# Patient Record
Sex: Male | Born: 1985 | Race: White | Hispanic: No | Marital: Married | Smoking: Current every day smoker
Health system: Southern US, Community
[De-identification: ages and names within clinical notes are randomized; demographics above are authoritative.]

## PROBLEM LIST (undated history)

## (undated) HISTORY — PX: KNEE SURGERY: SHX244

## (undated) HISTORY — PX: HIP SURGERY: SHX245

---

## 2007-07-15 ENCOUNTER — Ambulatory Visit: Payer: Self-pay | Admitting: Family Medicine

## 2007-07-15 DIAGNOSIS — J309 Allergic rhinitis, unspecified: Secondary | ICD-10-CM | POA: Insufficient documentation

## 2007-07-15 DIAGNOSIS — E669 Obesity, unspecified: Secondary | ICD-10-CM

## 2007-07-15 DIAGNOSIS — K219 Gastro-esophageal reflux disease without esophagitis: Secondary | ICD-10-CM

## 2007-07-15 DIAGNOSIS — F17200 Nicotine dependence, unspecified, uncomplicated: Secondary | ICD-10-CM

## 2007-08-26 ENCOUNTER — Encounter (INDEPENDENT_AMBULATORY_CARE_PROVIDER_SITE_OTHER): Payer: Self-pay | Admitting: Family Medicine

## 2007-12-15 ENCOUNTER — Emergency Department (HOSPITAL_COMMUNITY): Admission: EM | Admit: 2007-12-15 | Discharge: 2007-12-15 | Payer: Self-pay | Admitting: Emergency Medicine

## 2008-05-01 ENCOUNTER — Ambulatory Visit: Payer: Self-pay | Admitting: Family Medicine

## 2008-05-05 ENCOUNTER — Ambulatory Visit (HOSPITAL_COMMUNITY): Admission: RE | Admit: 2008-05-05 | Discharge: 2008-05-05 | Payer: Self-pay | Admitting: Family Medicine

## 2008-11-06 ENCOUNTER — Ambulatory Visit: Payer: Self-pay | Admitting: Family Medicine

## 2008-11-08 LAB — CONVERTED CEMR LAB
AST: 16 units/L (ref 0–37)
Alkaline Phosphatase: 80 units/L (ref 39–117)
BUN: 15 mg/dL (ref 6–23)
Calcium: 9.3 mg/dL (ref 8.4–10.5)
Creatinine, Ser: 1.04 mg/dL (ref 0.40–1.50)
GC Probe Amp, Urine: NEGATIVE
HCV Ab: NEGATIVE
Hep A IgM: NEGATIVE
Total Bilirubin: 0.5 mg/dL (ref 0.3–1.2)

## 2009-05-05 ENCOUNTER — Emergency Department (HOSPITAL_COMMUNITY): Admission: EM | Admit: 2009-05-05 | Discharge: 2009-05-05 | Payer: Self-pay | Admitting: Emergency Medicine

## 2009-10-12 ENCOUNTER — Emergency Department (HOSPITAL_COMMUNITY): Admission: EM | Admit: 2009-10-12 | Discharge: 2009-10-12 | Payer: Self-pay | Admitting: Emergency Medicine

## 2010-01-09 ENCOUNTER — Emergency Department (HOSPITAL_COMMUNITY): Admission: EM | Admit: 2010-01-09 | Discharge: 2010-01-09 | Payer: Self-pay | Admitting: Emergency Medicine

## 2010-06-08 LAB — URINALYSIS, ROUTINE W REFLEX MICROSCOPIC
Bilirubin Urine: NEGATIVE
Ketones, ur: NEGATIVE mg/dL
Nitrite: NEGATIVE
Protein, ur: NEGATIVE mg/dL
pH: 6 (ref 5.0–8.0)

## 2010-08-04 ENCOUNTER — Emergency Department (HOSPITAL_COMMUNITY): Payer: Self-pay

## 2010-08-04 ENCOUNTER — Emergency Department (HOSPITAL_COMMUNITY)
Admission: EM | Admit: 2010-08-04 | Discharge: 2010-08-04 | Disposition: A | Discharge status: Home / Self Care | Payer: Self-pay | Attending: Emergency Medicine | Admitting: Emergency Medicine

## 2010-08-04 DIAGNOSIS — R059 Cough, unspecified: Secondary | ICD-10-CM | POA: Insufficient documentation

## 2010-08-04 DIAGNOSIS — R509 Fever, unspecified: Secondary | ICD-10-CM | POA: Insufficient documentation

## 2010-08-04 DIAGNOSIS — J329 Chronic sinusitis, unspecified: Secondary | ICD-10-CM | POA: Insufficient documentation

## 2010-08-04 DIAGNOSIS — R05 Cough: Secondary | ICD-10-CM | POA: Insufficient documentation

## 2010-08-04 DIAGNOSIS — Z9889 Other specified postprocedural states: Secondary | ICD-10-CM | POA: Insufficient documentation

## 2012-04-05 ENCOUNTER — Emergency Department (HOSPITAL_COMMUNITY)
Admission: EM | Admit: 2012-04-05 | Discharge: 2012-04-05 | Disposition: A | Discharge status: Home / Self Care | Payer: Medicaid Other | Attending: Emergency Medicine | Admitting: Emergency Medicine

## 2012-04-05 ENCOUNTER — Emergency Department (HOSPITAL_COMMUNITY): Payer: Medicaid Other

## 2012-04-05 ENCOUNTER — Encounter (HOSPITAL_COMMUNITY): Payer: Self-pay | Admitting: *Deleted

## 2012-04-05 DIAGNOSIS — J329 Chronic sinusitis, unspecified: Secondary | ICD-10-CM

## 2012-04-05 DIAGNOSIS — IMO0002 Reserved for concepts with insufficient information to code with codable children: Secondary | ICD-10-CM | POA: Insufficient documentation

## 2012-04-05 DIAGNOSIS — R0989 Other specified symptoms and signs involving the circulatory and respiratory systems: Secondary | ICD-10-CM | POA: Insufficient documentation

## 2012-04-05 DIAGNOSIS — B9789 Other viral agents as the cause of diseases classified elsewhere: Secondary | ICD-10-CM | POA: Insufficient documentation

## 2012-04-05 DIAGNOSIS — B349 Viral infection, unspecified: Secondary | ICD-10-CM

## 2012-04-05 DIAGNOSIS — Z79899 Other long term (current) drug therapy: Secondary | ICD-10-CM | POA: Insufficient documentation

## 2012-04-05 DIAGNOSIS — F17200 Nicotine dependence, unspecified, uncomplicated: Secondary | ICD-10-CM | POA: Insufficient documentation

## 2012-04-05 DIAGNOSIS — J3489 Other specified disorders of nose and nasal sinuses: Secondary | ICD-10-CM | POA: Insufficient documentation

## 2012-04-05 MED ORDER — ALBUTEROL SULFATE HFA 108 (90 BASE) MCG/ACT IN AERS
2.0000 | INHALATION_SPRAY | Freq: Once | RESPIRATORY_TRACT | Status: AC
  Administered 2012-04-05: 2 via RESPIRATORY_TRACT
  Filled 2012-04-05: qty 6.7

## 2012-04-05 MED ORDER — HYDROCOD POLST-CHLORPHEN POLST 10-8 MG/5ML PO LQCR: 5.0000 mL | Freq: Two times a day (BID) | ORAL | Status: DC

## 2012-04-05 MED ORDER — AMOXICILLIN-POT CLAVULANATE 875-125 MG PO TABS: 1.0000 | ORAL_TABLET | Freq: Two times a day (BID) | ORAL | Status: DC

## 2012-04-05 NOTE — ED Notes (Signed)
Cough and congestion x 1 week. States cough was productive, yellow in color but cough is now non productive.

## 2012-04-05 NOTE — ED Notes (Signed)
Pt with cough and chest congestion x 1 week

## 2012-04-07 NOTE — ED Provider Notes (Signed)
History     CSN: [id number]  Arrival date & time 04/05/12  1126   First MD Initiated Contact with Patient 04/05/12 1146      Chief Complaint  Patient presents with  . Cough  . Nasal Congestion    (Consider location/radiation/quality/duration/timing/severity/associated sxs/prior treatment) HPI Comments: Spencer Robinson is a 27 y.o. Male who presents with a 7 day history of uri type symptoms which includes nasal congestion with clear rhinorrhea, sinus pain and cough which was productive of yellow sputum,  But has become a more frequent cough but nonproductive.   Symptoms due to not include shortness of breath, chest pain,  Nausea, vomiting or diarrhea.  The patient has taken mucinex and nyquil  prior to arrival with no significant improvement in symptoms.    The history is provided by the patient.    History reviewed. No pertinent past medical history.  Past Surgical History  Procedure Date  . Hip surgery   . Knee surgery     No family history on file.  History  Substance Use Topics  . Smoking status: Current Every Day Smoker  . Smokeless tobacco: Not on file  . Alcohol Use: No      Review of Systems  Constitutional: Negative for fever.  HENT: Positive for congestion and rhinorrhea. Negative for sore throat and neck pain.   Eyes: Negative.   Respiratory: Positive for cough. Negative for chest tightness and shortness of breath.   Cardiovascular: Negative for chest pain.  Gastrointestinal: Negative for nausea and abdominal pain.  Genitourinary: Negative.   Musculoskeletal: Negative for joint swelling and arthralgias.  Skin: Negative.  Negative for rash and wound.  Neurological: Negative for dizziness, weakness, light-headedness, numbness and headaches.  Hematological: Negative.   Psychiatric/Behavioral: Negative.     Allergies  Review of patient's allergies indicates no known allergies.  Home Medications   Current Outpatient Rx  Name  Route  Sig  Dispense   Refill  . DM-GUAIFENESIN ER 30-600 MG PO TB12   Oral   Take 1 tablet by mouth every 12 (twelve) hours.         Marland Kitchen FLUTICASONE PROPIONATE 50 MCG/ACT NA SUSP   Nasal   Place 2 sprays into the nose daily.         Marland Kitchen PSEUDOEPH-DOXYLAMINE-DM-APAP 60-12.08-20-998 MG/30ML PO LIQD   Oral   Take 30 mLs by mouth every 4 (four) hours as needed. Cough and congestion.         . AMOXICILLIN-POT CLAVULANATE 875-125 MG PO TABS   Oral   Take 1 tablet by mouth 2 (two) times daily.   20 tablet   0   . HYDROCOD POLST-CPM POLST ER 10-8 MG/5ML PO LQCR   Oral   Take 5 mLs by mouth every 12 (twelve) hours.   100 mL   0     BP 135/82  Pulse 63  Temp 98.2 F (36.8 C) (Oral)  Resp 20  Ht 5\' 10"  (1.778 m)  Wt 240 lb (108.863 kg)  BMI 34.44 kg/m2  SpO2 94%  Physical Exam  Constitutional: He is oriented to person, place, and time. He appears well-developed and well-nourished.  HENT:  Head: Normocephalic and atraumatic.  Right Ear: Tympanic membrane and ear canal normal.  Left Ear: Tympanic membrane and ear canal normal.  Nose: Mucosal edema and rhinorrhea present. Right sinus exhibits maxillary sinus tenderness. Left sinus exhibits maxillary sinus tenderness.  Mouth/Throat: Uvula is midline, oropharynx is clear and moist and mucous membranes are normal.  No oropharyngeal exudate, posterior oropharyngeal edema, posterior oropharyngeal erythema or tonsillar abscesses.  Eyes: Conjunctivae normal are normal.  Cardiovascular: Normal rate and normal heart sounds.   Pulmonary/Chest: Effort normal. No respiratory distress. He has no decreased breath sounds. He has no wheezes. He has no rales.       Coarse crackles which clear with cough.  Abdominal: Soft. There is no tenderness.  Musculoskeletal: Normal range of motion.  Neurological: He is alert and oriented to person, place, and time.  Skin: Skin is warm and dry. No rash noted.  Psychiatric: He has a normal mood and affect.    ED Course    Procedures (including critical care time)  Labs Reviewed - No data to display No results found.   1. Viral syndrome   2. Sinusitis       MDM  Augmentin, tussionex, encouraged rest,  Increased fluid intake,  Continued mucinex.         Burgess Amor, [place] 04/07/12 2158

## 2012-04-07 NOTE — ED Provider Notes (Signed)
Medical screening examination/treatment/procedure(s) were performed by non-physician practitioner and as supervising physician I was immediately available for consultation/collaboration.   Joya Gaskins, MD 04/07/12 2322

## 2012-11-19 ENCOUNTER — Encounter (HOSPITAL_COMMUNITY): Payer: Self-pay | Admitting: *Deleted

## 2012-11-19 ENCOUNTER — Emergency Department (HOSPITAL_COMMUNITY)
Admission: EM | Admit: 2012-11-19 | Discharge: 2012-11-19 | Disposition: A | Discharge status: Home / Self Care | Payer: Medicaid Other | Attending: Emergency Medicine | Admitting: Emergency Medicine

## 2012-11-19 ENCOUNTER — Emergency Department (HOSPITAL_COMMUNITY): Payer: Medicaid Other

## 2012-11-19 DIAGNOSIS — M25569 Pain in unspecified knee: Secondary | ICD-10-CM | POA: Insufficient documentation

## 2012-11-19 DIAGNOSIS — M2550 Pain in unspecified joint: Secondary | ICD-10-CM | POA: Insufficient documentation

## 2012-11-19 DIAGNOSIS — IMO0002 Reserved for concepts with insufficient information to code with codable children: Secondary | ICD-10-CM | POA: Insufficient documentation

## 2012-11-19 DIAGNOSIS — M549 Dorsalgia, unspecified: Secondary | ICD-10-CM

## 2012-11-19 DIAGNOSIS — M12359 Palindromic rheumatism, unspecified hip: Secondary | ICD-10-CM | POA: Insufficient documentation

## 2012-11-19 DIAGNOSIS — Z791 Long term (current) use of non-steroidal anti-inflammatories (NSAID): Secondary | ICD-10-CM | POA: Insufficient documentation

## 2012-11-19 DIAGNOSIS — J029 Acute pharyngitis, unspecified: Secondary | ICD-10-CM | POA: Insufficient documentation

## 2012-11-19 DIAGNOSIS — J069 Acute upper respiratory infection, unspecified: Secondary | ICD-10-CM

## 2012-11-19 DIAGNOSIS — F17200 Nicotine dependence, unspecified, uncomplicated: Secondary | ICD-10-CM | POA: Insufficient documentation

## 2012-11-19 MED ORDER — BENZONATATE 100 MG PO CAPS: 100.0000 mg | ORAL_CAPSULE | Freq: Three times a day (TID) | ORAL | Status: DC | PRN

## 2012-11-19 MED ORDER — MELOXICAM 7.5 MG PO TABS: 15.0000 mg | ORAL_TABLET | Freq: Every day | ORAL | Status: DC

## 2012-11-19 MED ORDER — KETOROLAC TROMETHAMINE 60 MG/2ML IM SOLN
60.0000 mg | Freq: Once | INTRAMUSCULAR | Status: AC
  Administered 2012-11-19: 60 mg via INTRAMUSCULAR
  Filled 2012-11-19: qty 2
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
## 2012-11-19 NOTE — ED Notes (Signed)
Pt c/o a deep cough since Tuesday and pain in his left knee and left hip.

## 2013-03-04 ENCOUNTER — Emergency Department (HOSPITAL_COMMUNITY)
Admission: EM | Admit: 2013-03-04 | Discharge: 2013-03-04 | Disposition: A | Discharge status: Home / Self Care | Payer: Medicaid Other | Attending: Emergency Medicine | Admitting: Emergency Medicine

## 2013-03-04 ENCOUNTER — Encounter (HOSPITAL_COMMUNITY): Payer: Self-pay | Admitting: Emergency Medicine

## 2013-03-04 DIAGNOSIS — R51 Headache: Secondary | ICD-10-CM | POA: Insufficient documentation

## 2013-03-04 DIAGNOSIS — Y9389 Activity, other specified: Secondary | ICD-10-CM | POA: Insufficient documentation

## 2013-03-04 DIAGNOSIS — S025XXA Fracture of tooth (traumatic), initial encounter for closed fracture: Secondary | ICD-10-CM | POA: Insufficient documentation

## 2013-03-04 DIAGNOSIS — F17200 Nicotine dependence, unspecified, uncomplicated: Secondary | ICD-10-CM | POA: Insufficient documentation

## 2013-03-04 DIAGNOSIS — Y9289 Other specified places as the place of occurrence of the external cause: Secondary | ICD-10-CM | POA: Insufficient documentation

## 2013-03-04 DIAGNOSIS — X58XXXA Exposure to other specified factors, initial encounter: Secondary | ICD-10-CM | POA: Insufficient documentation

## 2013-03-04 MED ORDER — AMOXICILLIN 250 MG PO CAPS
500.0000 mg | ORAL_CAPSULE | Freq: Once | ORAL | Status: AC
  Administered 2013-03-04: 500 mg via ORAL
  Filled 2013-03-04: qty 2

## 2013-03-04 MED ORDER — HYDROCODONE-ACETAMINOPHEN 5-325 MG PO TABS: 1.0000 | ORAL_TABLET | ORAL | Status: DC | PRN

## 2013-03-04 MED ORDER — AMOXICILLIN 500 MG PO CAPS: 500.0000 mg | ORAL_CAPSULE | Freq: Three times a day (TID) | ORAL | Status: DC

## 2013-03-04 MED ORDER — IBUPROFEN 800 MG PO TABS
800.0000 mg | ORAL_TABLET | Freq: Once | ORAL | Status: AC
  Administered 2013-03-04: 800 mg via ORAL
  Filled 2013-03-04: qty 1

## 2013-03-04 MED ORDER — IBUPROFEN 800 MG PO TABS: 800.0000 mg | ORAL_TABLET | Freq: Three times a day (TID) | ORAL | Status: DC

## 2013-03-04 NOTE — ED Provider Notes (Signed)
CSN: [id number]     Arrival date & time 03/04/13  1931 History   First MD Initiated Contact with Patient 03/04/13 2016     Chief Complaint  Patient presents with  . Dental Pain   (Consider location/radiation/quality/duration/timing/severity/associated sxs/prior Treatment) Patient is a 27 y.o. male presenting with tooth pain. The history is provided by the patient.  Dental Pain Location:  Lower Lower teeth location:  31/RL 2nd molar Severity:  Moderate Onset quality:  Sudden Duration:  6 hours Timing:  Constant Progression:  Worsening Chronicity:  New Context: dental fracture   Context: not abscess   Relieved by:  Nothing Worsened by:  Cold food/drink and hot food/drink Ineffective treatments:  None tried Associated symptoms: facial pain   Associated symptoms: no neck pain     History reviewed. No pertinent past medical history. Past Surgical History  Procedure Laterality Date  . Hip surgery    . Knee surgery     History reviewed. No pertinent family history. History  Substance Use Topics  . Smoking status: Current Every Day Smoker  . Smokeless tobacco: Not on file  . Alcohol Use: No    Review of Systems  Constitutional: Negative for activity change.       All ROS Neg except as noted in HPI  HENT: Negative for nosebleeds.   Eyes: Negative for photophobia and discharge.  Respiratory: Negative for cough, shortness of breath and wheezing.   Cardiovascular: Negative for chest pain and palpitations.  Gastrointestinal: Negative for abdominal pain and blood in stool.  Genitourinary: Negative for dysuria, frequency and hematuria.  Musculoskeletal: Negative for arthralgias, back pain and neck pain.  Skin: Negative.   Neurological: Negative for dizziness, seizures and speech difficulty.  Psychiatric/Behavioral: Negative for hallucinations and confusion.    Allergies  Review of patient's allergies indicates no known allergies.  Home Medications  No current outpatient  prescriptions on file. BP 121/69  Pulse 76  Temp(Src) 98.3 F (36.8 C) (Oral)  Resp 18  Ht 5\' 10"  (1.778 m)  Wt 250 lb (113.399 kg)  BMI 35.87 kg/m2  SpO2 98% Physical Exam  Nursing note and vitals reviewed. Constitutional: He is oriented to person, place, and time. He appears well-developed and well-nourished.  Non-toxic appearance.  HENT:  Head: Normocephalic.  Right Ear: Tympanic membrane and external ear normal.  Left Ear: Tympanic membrane and external ear normal.  Mouth/Throat:    Airway patent. No swelling under the tongue.  Eyes: EOM and lids are normal. Pupils are equal, round, and reactive to light.  Neck: Normal range of motion. Neck supple. Carotid bruit is not present.  Cardiovascular: Normal rate, regular rhythm, normal heart sounds, intact distal pulses and normal pulses.   Pulmonary/Chest: Breath sounds normal. No respiratory distress.  Abdominal: Soft. Bowel sounds are normal. There is no tenderness. There is no guarding.  Musculoskeletal: Normal range of motion.  Lymphadenopathy:       Head (right side): No submandibular adenopathy present.       Head (left side): No submandibular adenopathy present.    He has no cervical adenopathy.  Neurological: He is alert and oriented to person, place, and time. He has normal strength. No cranial nerve deficit or sensory deficit.  Skin: Skin is warm and dry.  Psychiatric: He has a normal mood and affect. His speech is normal.    ED Course  Procedures (including critical care time) Labs Review Labs Reviewed - No data to display Imaging Review No results found.  EKG Interpretation  None       MDM  No diagnosis found. **I have reviewed nursing notes, vital signs, and all appropriate lab and imaging results for this patient.*  The patient states he was eating lunch when he took a bite and felt a crunch on the left back to. He then noticed a sharp edge that was cutting on his tongue, and a pain that was from his  jaw into his face. There's been no high fever or reported.  Patient has a broken tooth at the left lower mandible area. Patient advised to use temporary filling from the drugstore. Prescription for Amoxil, hydrocodone, and ibuprofen given to the patient. Patient has an appointment with the dentist on Monday, December 15.  Kathie Dike, PA-C 03/04/13 2033

## 2013-03-04 NOTE — ED Notes (Signed)
Pain lt mandibular molar , broke today when eating

## 2013-03-05 NOTE — ED Provider Notes (Signed)
History/physical exam/procedure(s) were performed by non-physician practitioner and as supervising physician I was immediately available for consultation/collaboration. I have reviewed all notes and am in agreement with care and plan.   Hilario Quarry, MD 03/05/13 [phone]

## 2013-08-30 ENCOUNTER — Encounter (HOSPITAL_COMMUNITY): Payer: Self-pay | Admitting: Emergency Medicine

## 2013-08-30 DIAGNOSIS — R05 Cough: Secondary | ICD-10-CM | POA: Insufficient documentation

## 2013-08-30 DIAGNOSIS — F17200 Nicotine dependence, unspecified, uncomplicated: Secondary | ICD-10-CM | POA: Insufficient documentation

## 2013-08-30 DIAGNOSIS — J3489 Other specified disorders of nose and nasal sinuses: Secondary | ICD-10-CM | POA: Insufficient documentation

## 2013-08-30 DIAGNOSIS — Z791 Long term (current) use of non-steroidal anti-inflammatories (NSAID): Secondary | ICD-10-CM | POA: Insufficient documentation

## 2013-08-30 DIAGNOSIS — Z792 Long term (current) use of antibiotics: Secondary | ICD-10-CM | POA: Insufficient documentation

## 2013-08-30 DIAGNOSIS — R059 Cough, unspecified: Secondary | ICD-10-CM | POA: Insufficient documentation

## 2013-08-30 DIAGNOSIS — IMO0001 Reserved for inherently not codable concepts without codable children: Secondary | ICD-10-CM | POA: Insufficient documentation

## 2013-08-30 DIAGNOSIS — R079 Chest pain, unspecified: Secondary | ICD-10-CM | POA: Insufficient documentation

## 2013-08-30 NOTE — ED Notes (Signed)
Pt c/o cough since Sunday.

## 2013-08-31 ENCOUNTER — Emergency Department (HOSPITAL_COMMUNITY)
Admission: EM | Admit: 2013-08-31 | Discharge: 2013-08-31 | Disposition: A | Discharge status: Home / Self Care | Payer: Medicaid Other | Attending: Emergency Medicine | Admitting: Emergency Medicine

## 2013-08-31 DIAGNOSIS — R05 Cough: Secondary | ICD-10-CM

## 2013-08-31 DIAGNOSIS — R059 Cough, unspecified: Secondary | ICD-10-CM

## 2013-08-31 MED ORDER — GUAIFENESIN-CODEINE 100-10 MG/5ML PO SYRP: 10.0000 mL | ORAL_SOLUTION | Freq: Three times a day (TID) | ORAL | Status: DC | PRN

## 2013-08-31 MED ORDER — BENZONATATE 100 MG PO CAPS
200.0000 mg | ORAL_CAPSULE | Freq: Once | ORAL | Status: AC
  Administered 2013-08-31: 200 mg via ORAL
  Filled 2013-08-31: qty 2

## 2013-08-31 MED ORDER — AZITHROMYCIN 250 MG PO TABS: ORAL_TABLET | ORAL | Status: DC

## 2013-08-31 MED ORDER — AZITHROMYCIN 250 MG PO TABS
500.0000 mg | ORAL_TABLET | Freq: Once | ORAL | Status: AC
  Administered 2013-08-31: 500 mg via ORAL
  Filled 2013-08-31: qty 2

## 2013-08-31 NOTE — Discharge Instructions (Signed)
Cough, Adult  A cough is a reflex. It helps you clear your throat and airways. A cough can help heal your body. A cough can last 2 or 3 weeks (acute) or may last more than 8 weeks (chronic). Some common causes of a cough can include an infection, allergy, or a cold. HOME CARE  Only take medicine as told by your doctor.  If given, take your medicines (antibiotics) as told. Finish them even if you start to feel better.  Use a cold steam vaporizer or humidier in your home. This can help loosen thick spit (secretions).  Sleep so you are almost sitting up (semi-upright). Use pillows to do this. This helps reduce coughing.  Rest as needed.  Stop smoking if you smoke. GET HELP RIGHT AWAY IF:  You have yellowish-white fluid (pus) in your thick spit.  Your cough gets worse.  Your medicine does not reduce coughing, and you are losing sleep.  You cough up blood.  You have trouble breathing.  Your pain gets worse and medicine does not help.  You have a fever. MAKE SURE YOU:   Understand these instructions.  Will watch your condition.  Will get help right away if you are not doing well or get worse. Document Released: 11/21/2010 Document Revised: 06/02/2011 Document Reviewed: 11/21/2010 [employer] Patient Information 2014 [employer].  

## 2013-09-01 NOTE — ED Provider Notes (Signed)
Medical screening examination/treatment/procedure(s) were performed by non-physician practitioner and as supervising physician I was immediately available for consultation/collaboration.   Justis Closser, MD 09/01/13 2259 

## 2013-09-01 NOTE — ED Provider Notes (Signed)
CSN: [id number]     Arrival date & time 08/30/13  2309 History   First MD Initiated Contact with Patient 08/30/13 2354     Chief Complaint  Patient presents with  . Cough     (Consider location/radiation/quality/duration/timing/severity/associated sxs/prior Treatment) Patient is a 28 y.o. male presenting with cough. The history is provided by the patient.  Cough Cough characteristics:  Productive Sputum characteristics:  Yellow Severity:  Mild Onset quality:  Gradual Duration:  2 days Timing:  Intermittent Progression:  Unchanged Chronicity:  New Smoker: yes   Context: upper respiratory infection   Context: not exposure to allergens, not fumes, not sick contacts and not smoke exposure   Relieved by:  Nothing Worsened by:  Nothing tried Associated symptoms: myalgias   Associated symptoms: no chest pain, no chills, no ear pain, no fever, no headaches, no rash, no rhinorrhea, no shortness of breath, no sinus congestion, no sore throat and no wheezing   Myalgias:    Location:  Generalized   Quality:  Aching   Severity:  Mild   Timing:  Constant   Progression:  Unchanged   History reviewed. No pertinent past medical history. Past Surgical History  Procedure Laterality Date  . Hip surgery    . Knee surgery     History reviewed. No pertinent family history. History  Substance Use Topics  . Smoking status: Current Every Day Smoker  . Smokeless tobacco: Not on file  . Alcohol Use: No    Review of Systems  Constitutional: Negative for fever, chills, activity change and appetite change.  HENT: Positive for congestion. Negative for ear pain, facial swelling, rhinorrhea, sore throat and trouble swallowing.   Eyes: Negative for visual disturbance.  Respiratory: Positive for cough and chest tightness. Negative for shortness of breath, wheezing and stridor.   Cardiovascular: Negative for chest pain.  Gastrointestinal: Negative for nausea and vomiting.  Genitourinary: Negative  for dysuria, hematuria and flank pain.  Musculoskeletal: Positive for myalgias. Negative for neck pain and neck stiffness.  Skin: Negative.  Negative for rash.  Neurological: Negative for dizziness, weakness, numbness and headaches.  Hematological: Negative for adenopathy.  Psychiatric/Behavioral: Negative for confusion.  All other systems reviewed and are negative.     Allergies  Review of patient's allergies indicates no known allergies.  Home Medications   Prior to Admission medications   Medication Sig Start Date End Date Taking? Authorizing Provider  amoxicillin (AMOXIL) 500 MG capsule Take 1 capsule (500 mg total) by mouth 3 (three) times daily. 03/04/13   Kathie Dike, PA-C  azithromycin (ZITHROMAX) 250 MG tablet Take two tablets on day one, then one tab qd days 2-5 08/31/13   Loreal Schuessler L. Cleaster Shiffer, PA-C  guaiFENesin-codeine (ROBITUSSIN AC) 100-10 MG/5ML syrup Take 10 mLs by mouth 3 (three) times daily as needed for cough. 08/31/13   Jaleya Pebley L. Jalena Vanderlinden, PA-C  HYDROcodone-acetaminophen (NORCO) 5-325 MG per tablet Take 1 tablet by mouth every 4 (four) hours as needed for moderate pain. 03/04/13   Kathie Dike, PA-C  ibuprofen (ADVIL,MOTRIN) 800 MG tablet Take 1 tablet (800 mg total) by mouth 3 (three) times daily. 03/04/13   Kathie Dike, PA-C   BP 123/73  Pulse 72  Temp(Src) 98.2 F (36.8 C) (Oral)  Resp 18  Ht 5\' 10"  (1.778 m)  Wt 250 lb (113.399 kg)  BMI 35.87 kg/m2  SpO2 95% Physical Exam  Nursing note and vitals reviewed. Constitutional: He is oriented to person, place, and time. He appears well-developed and  well-nourished. No distress.  HENT:  Head: Normocephalic and atraumatic.  Right Ear: Tympanic membrane and ear canal normal.  Left Ear: Tympanic membrane and ear canal normal.  Mouth/Throat: Uvula is midline, oropharynx is clear and moist and mucous membranes are normal. No oropharyngeal exudate.  Eyes: EOM are normal. Pupils are equal, round, and reactive to  light.  Neck: Normal range of motion, full passive range of motion without pain and phonation normal. Neck supple.  Cardiovascular: Normal rate, regular rhythm, normal heart sounds and intact distal pulses.   No murmur heard. Pulmonary/Chest: Effort normal. No stridor. No respiratory distress. He has no wheezes. He has no rales. He exhibits no tenderness.  Coarse lungs sounds bilaterally. No rales or wheezing  Musculoskeletal: Normal range of motion. He exhibits no edema.  Lymphadenopathy:    He has no cervical adenopathy.  Neurological: He is alert and oriented to person, place, and time. He exhibits normal muscle tone. Coordination normal.  Skin: Skin is warm and dry.    ED Course  Procedures (including critical care time) Labs Review Labs Reviewed - No data to display  Imaging Review No results found.   EKG Interpretation None      MDM   Final diagnoses:  Cough    VSS.  Pt is well appearing.  Non-toxic.  Current smoker with productive cough.  No rales or wheezes. PERC negative.  Pt agrees to tx with z pack and tessalon , fluids and ibuprofen if needed.  He appears stable for d/c and agrees to return if needed.  appears stable for d/c     Deric Bocock L. Trisha Mangleriplett, PA-C 09/01/13 1724

## 2013-10-01 ENCOUNTER — Encounter (HOSPITAL_COMMUNITY): Payer: Self-pay | Admitting: Emergency Medicine

## 2013-10-01 ENCOUNTER — Emergency Department (HOSPITAL_COMMUNITY)
Admission: EM | Admit: 2013-10-01 | Discharge: 2013-10-01 | Disposition: A | Discharge status: Home / Self Care | Payer: Medicaid Other | Attending: Emergency Medicine | Admitting: Emergency Medicine

## 2013-10-01 DIAGNOSIS — Z792 Long term (current) use of antibiotics: Secondary | ICD-10-CM | POA: Insufficient documentation

## 2013-10-01 DIAGNOSIS — IMO0002 Reserved for concepts with insufficient information to code with codable children: Secondary | ICD-10-CM | POA: Diagnosis not present

## 2013-10-01 DIAGNOSIS — S4980XA Other specified injuries of shoulder and upper arm, unspecified arm, initial encounter: Secondary | ICD-10-CM | POA: Diagnosis present

## 2013-10-01 DIAGNOSIS — F17200 Nicotine dependence, unspecified, uncomplicated: Secondary | ICD-10-CM | POA: Diagnosis not present

## 2013-10-01 DIAGNOSIS — S46909A Unspecified injury of unspecified muscle, fascia and tendon at shoulder and upper arm level, unspecified arm, initial encounter: Secondary | ICD-10-CM | POA: Diagnosis present

## 2013-10-01 DIAGNOSIS — S46919A Strain of unspecified muscle, fascia and tendon at shoulder and upper arm level, unspecified arm, initial encounter: Secondary | ICD-10-CM

## 2013-10-01 DIAGNOSIS — S40029A Contusion of unspecified upper arm, initial encounter: Secondary | ICD-10-CM | POA: Diagnosis not present

## 2013-10-01 DIAGNOSIS — Y9289 Other specified places as the place of occurrence of the external cause: Secondary | ICD-10-CM | POA: Insufficient documentation

## 2013-10-01 DIAGNOSIS — X58XXXA Exposure to other specified factors, initial encounter: Secondary | ICD-10-CM | POA: Insufficient documentation

## 2013-10-01 DIAGNOSIS — Z791 Long term (current) use of non-steroidal anti-inflammatories (NSAID): Secondary | ICD-10-CM | POA: Insufficient documentation

## 2013-10-01 DIAGNOSIS — Z79899 Other long term (current) drug therapy: Secondary | ICD-10-CM | POA: Diagnosis not present

## 2013-10-01 DIAGNOSIS — S301XXA Contusion of abdominal wall, initial encounter: Secondary | ICD-10-CM | POA: Diagnosis not present

## 2013-10-01 DIAGNOSIS — Y9389 Activity, other specified: Secondary | ICD-10-CM | POA: Diagnosis not present

## 2013-10-01 MED ORDER — METHOCARBAMOL 500 MG PO TABS: 500.0000 mg | ORAL_TABLET | Freq: Three times a day (TID) | ORAL | Status: DC

## 2013-10-01 MED ORDER — IBUPROFEN 800 MG PO TABS
800.0000 mg | ORAL_TABLET | Freq: Once | ORAL | Status: AC
  Administered 2013-10-01: 800 mg via ORAL
  Filled 2013-10-01: qty 1

## 2013-10-01 MED ORDER — IBUPROFEN 800 MG PO TABS: 800.0000 mg | ORAL_TABLET | Freq: Three times a day (TID) | ORAL | Status: DC

## 2013-10-01 MED ORDER — HYDROCODONE-ACETAMINOPHEN 5-325 MG PO TABS: 1.0000 | ORAL_TABLET | ORAL | Status: DC | PRN

## 2013-10-01 MED ORDER — ACETAMINOPHEN 500 MG PO TABS
1000.0000 mg | ORAL_TABLET | Freq: Once | ORAL | Status: AC
  Administered 2013-10-01: 1000 mg via ORAL
  Filled 2013-10-01: qty 2
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
## 2013-10-01 NOTE — ED Provider Notes (Signed)
CSN: [id number]     Arrival date & time 10/01/13  2102 History   First MD Initiated Contact with Patient 10/01/13 2111     No chief complaint on file.    (Consider location/radiation/quality/duration/timing/severity/associated sxs/prior Treatment) HPI Comments: Patient states that today around noon or 1:00 PM he was lowering a vault into a grave, when his lowering device nest up in one of the cables snapped. The patient states he sustained pain and injury to the forearms (right and left). He also noticed a bruise to the right lower abdomen. The patient complains of pain and soreness of both there's been increasing since approximately 1 PM today. The patient has not lost any control of his grip, there's been no problem with using his arms with exception of soreness. The patient denies being on any anticoagulation medications. The patient denies having any bleeding disorders.  The history is provided by the patient.    History reviewed. No pertinent past medical history. Past Surgical History  Procedure Laterality Date  . Hip surgery    . Knee surgery     History reviewed. No pertinent family history. History  Substance Use Topics  . Smoking status: Current Every Day Smoker  . Smokeless tobacco: Not on file  . Alcohol Use: No    Review of Systems  Constitutional: Negative for activity change.       All ROS Neg except as noted in HPI  HENT: Negative for nosebleeds.   Eyes: Negative for photophobia and discharge.  Respiratory: Negative for cough, shortness of breath and wheezing.   Cardiovascular: Negative for chest pain and palpitations.  Gastrointestinal: Negative for abdominal pain and blood in stool.  Genitourinary: Negative for dysuria, frequency and hematuria.  Musculoskeletal: Negative for arthralgias, back pain and neck pain.  Skin: Negative.   Neurological: Negative for dizziness, seizures and speech difficulty.  Psychiatric/Behavioral: Negative for hallucinations and  confusion.      Allergies  Review of patient's allergies indicates no known allergies.  Home Medications   Prior to Admission medications   Medication Sig Start Date End Date Taking? Authorizing Provider  amoxicillin (AMOXIL) 500 MG capsule Take 1 capsule (500 mg total) by mouth 3 (three) times daily. 03/04/13   Kathie Dike, PA-C  azithromycin (ZITHROMAX) 250 MG tablet Take two tablets on day one, then one tab qd days 2-5 08/31/13   Tammy L. Triplett, PA-C  guaiFENesin-codeine (ROBITUSSIN AC) 100-10 MG/5ML syrup Take 10 mLs by mouth 3 (three) times daily as needed for cough. 08/31/13   Tammy L. Triplett, PA-C  HYDROcodone-acetaminophen (NORCO) 5-325 MG per tablet Take 1 tablet by mouth every 4 (four) hours as needed for moderate pain. 03/04/13   Kathie Dike, PA-C  ibuprofen (ADVIL,MOTRIN) 800 MG tablet Take 1 tablet (800 mg total) by mouth 3 (three) times daily. 03/04/13   Kathie Dike, PA-C   BP 143/76  Pulse 81  Temp(Src) 97.8 F (36.6 C) (Oral)  Resp 18  Ht 5\' 10"  (1.778 m)  Wt 240 lb (108.863 kg)  BMI 34.44 kg/m2  SpO2 97% Physical Exam  Nursing note and vitals reviewed. Constitutional: He is oriented to person, place, and time. He appears well-developed and well-nourished.  Non-toxic appearance.  HENT:  Head: Normocephalic.  Right Ear: Tympanic membrane and external ear normal.  Left Ear: Tympanic membrane and external ear normal.  Eyes: EOM and lids are normal. Pupils are equal, round, and reactive to light.  Neck: Normal range of motion. Neck supple. Carotid bruit  is not present.  Cardiovascular: Normal rate, regular rhythm, normal heart sounds, intact distal pulses and normal pulses.   Pulmonary/Chest: Breath sounds normal. No respiratory distress.  Abdominal: Soft. Bowel sounds are normal. There is no tenderness. There is no guarding.  There is a bruise to the right lower abdomen wall. Mild to moderate tenderness present. Bowel sounds are present and active.  No mass appreciated. No active, vomiting or nausea during the emergency department examination.  Musculoskeletal: Normal range of motion.  There is full range of motion of the shoulders and elbows. There is soreness from the elbow to the wrists at the ulnar aspect on the right. There is bruising from the mid forearm up to the elbow area. There is no effusion of the elbow.  There is soreness of the mid forearm on the left. No significant bruising noted on the left. I cannot demonstrate any deformity of the right or the left upper extremity. Capillary refill is less than 2 seconds. Radial pulses are 2+. There no evidence for compartment problems of the forearms.  Lymphadenopathy:       Head (right side): No submandibular adenopathy present.       Head (left side): No submandibular adenopathy present.    He has no cervical adenopathy.  Neurological: He is alert and oriented to person, place, and time. He has normal strength. No cranial nerve deficit or sensory deficit.  Skin: Skin is warm and dry.  Psychiatric: He has a normal mood and affect. His speech is normal.    ED Course  Procedures (including critical care time) Labs Review Labs Reviewed - No data to display  Imaging Review No results found.   EKG Interpretation None      MDM Patient has some bruising of the forearms of the right and the left. There is evidence of muscle strain involving the forearms of the right and left upper extremity. There is a bruise to the right lower abdomen wall. The patient is asked to use ice packs to these areas,   to rest the areas is much as possible. He is given a prescription for ibuprofen, Norco, and Robaxin. The patient is to return if any changes, problems, or concerns.                       Final diagnoses:  None    **I have reviewed nursing notes, vital signs, and all appropriate lab and imaging results for this patient.Kathie Dike*    Rafe Mackowski M Danyell Awbrey, PA-C 10/01/13 2149

## 2013-10-01 NOTE — ED Notes (Signed)
Pt states he was lowering a vault in the ground today and he think a cable may have snapped. Pt c/o bilateral forearm pain and has a big bruise on his lower abdomen.

## 2013-10-01 NOTE — ED Notes (Signed)
Patient verbalizes understanding of discharge instructions, medications, and home care. Pt ambulatory out of department at this time.

## 2013-10-01 NOTE — Discharge Instructions (Signed)
Your examination is consistent with a muscle strain and bruising of multiple sites. Please apply ice to your forearms anterior abdomen. Please rest these areas over the next 48 hours. Please use Norco every 4 hours if needed for pain. Please use ibuprofen and Robaxin daily. Robaxin and Norco may cause drowsiness, please use with caution. [facility] for additional followup if not improving. Contusion A contusion is a deep bruise. Contusions happen when an injury causes bleeding under the skin. Signs of bruising include pain, puffiness (swelling), and discolored skin. The contusion may turn blue, purple, or yellow. HOME CARE   Put ice on the injured area.  Put ice in a plastic bag.  Place a towel between your skin and the bag.  Leave the ice on for 15-20 minutes, 03-04 times a day.  Only take medicine as told by your doctor.  Rest the injured area.  If possible, raise (elevate) the injured area to lessen puffiness. GET HELP RIGHT AWAY IF:   You have more bruising or puffiness.  You have pain that is getting worse.  Your puffiness or pain is not helped by medicine. MAKE SURE YOU:   Understand these instructions.  Will watch your condition.  Will get help right away if you are not doing well or get worse. Document Released: 08/27/2007 Document Revised: 06/02/2011 Document Reviewed: 01/13/2011 [facility] Patient Information 2015 [place], [place]. This information is not intended to replace advice given to you by your health care provider. Make sure you discuss any questions you have with your health care provider.  Muscle Strain A muscle strain (pulled muscle) happens when a muscle is stretched beyond normal length. It happens when a sudden, violent force stretches your muscle too far. Usually, a few of the fibers in your muscle are torn. Muscle strain is common in athletes. Recovery usually takes 1-2 weeks. Complete healing takes 5-6 weeks.    HOME CARE   Follow the PRICE method of treatment to help your injury get better. Do this the first 2-3 days after the injury:  Protect. Protect the muscle to keep it from getting injured again.  Rest. Limit your activity and rest the injured body part.  Ice. Put ice in a plastic bag. Place a towel between your skin and the bag. Then, apply the ice and leave it on from 15-20 minutes each hour. After the third day, switch to moist heat packs.  Compression. Use a splint or elastic bandage on the injured area for comfort. Do not put it on too tightly.  Elevate. Keep the injured body part above the level of your heart.  Only take medicine as told by your doctor.  Warm up before doing exercise to prevent future muscle strains. GET HELP IF:   You have more pain or puffiness (swelling) in the injured area.  You feel numbness, tingling, or notice a loss of strength in the injured area. MAKE SURE YOU:   Understand these instructions.  Will watch your condition.  Will get help right away if you are not doing well or get worse. Document Released: 12/18/2007 Document Revised: 12/29/2012 Document Reviewed: 10/07/2012 [facility] Patient Information 2015 [place], [place]. This information is not intended to replace advice given to you by your health care provider. Make sure you discuss any questions you have with your health care provider.

## 2013-10-01 NOTE — ED Notes (Signed)
Ice packs applied to the abdomen & both for arms.

## 2013-10-02 NOTE — ED Provider Notes (Signed)
Medical screening examination/treatment/procedure(s) were performed by non-physician practitioner and as supervising physician I was immediately available for consultation/collaboration.   EKG Interpretation None        Aiyla Baucom L Mckade Gurka, MD 10/02/13 1454 

## 2013-12-20 ENCOUNTER — Encounter (HOSPITAL_COMMUNITY): Payer: Self-pay | Admitting: Emergency Medicine

## 2013-12-20 ENCOUNTER — Emergency Department (HOSPITAL_COMMUNITY)
Admission: EM | Admit: 2013-12-20 | Discharge: 2013-12-21 | Disposition: A | Discharge status: Home / Self Care | Payer: Medicaid Other | Attending: Emergency Medicine | Admitting: Emergency Medicine

## 2013-12-20 DIAGNOSIS — F17200 Nicotine dependence, unspecified, uncomplicated: Secondary | ICD-10-CM | POA: Diagnosis not present

## 2013-12-20 DIAGNOSIS — Z791 Long term (current) use of non-steroidal anti-inflammatories (NSAID): Secondary | ICD-10-CM | POA: Diagnosis not present

## 2013-12-20 DIAGNOSIS — Z792 Long term (current) use of antibiotics: Secondary | ICD-10-CM | POA: Diagnosis not present

## 2013-12-20 DIAGNOSIS — H9209 Otalgia, unspecified ear: Secondary | ICD-10-CM | POA: Diagnosis not present

## 2013-12-20 DIAGNOSIS — Z79899 Other long term (current) drug therapy: Secondary | ICD-10-CM | POA: Diagnosis not present

## 2013-12-20 DIAGNOSIS — J029 Acute pharyngitis, unspecified: Secondary | ICD-10-CM | POA: Insufficient documentation

## 2013-12-20 DIAGNOSIS — J039 Acute tonsillitis, unspecified: Secondary | ICD-10-CM | POA: Diagnosis not present

## 2013-12-20 NOTE — ED Notes (Signed)
Pt c/o sore throat and sinus drainage since Saturday evening.

## 2013-12-21 MED ORDER — MAGIC MOUTHWASH W/LIDOCAINE: 5.0000 mL | Freq: Three times a day (TID) | ORAL | Status: DC | PRN

## 2013-12-21 MED ORDER — AMOXICILLIN 250 MG PO CAPS
500.0000 mg | ORAL_CAPSULE | Freq: Once | ORAL | Status: AC
  Administered 2013-12-21: 500 mg via ORAL
  Filled 2013-12-21: qty 2

## 2013-12-21 MED ORDER — HYDROCODONE-ACETAMINOPHEN 7.5-325 MG/15ML PO SOLN
15.0000 mL | Freq: Once | ORAL | Status: AC
  Administered 2013-12-21: 15 mL via ORAL
  Filled 2013-12-21: qty 15

## 2013-12-21 MED ORDER — AMOXICILLIN 500 MG PO CAPS: 500.0000 mg | ORAL_CAPSULE | Freq: Three times a day (TID) | ORAL | Status: DC

## 2013-12-21 NOTE — ED Provider Notes (Signed)
Medical screening examination/treatment/procedure(s) were performed by non-physician practitioner and as supervising physician I was immediately available for consultation/collaboration.   EKG Interpretation None        Shakina Choy M Kashauna Celmer, MD 12/21/13 0018 

## 2013-12-21 NOTE — ED Provider Notes (Signed)
CSN: [id number]     Arrival date & time 12/20/13  2300 History   First MD Initiated Contact with Patient 12/20/13 2351     Chief Complaint  Patient presents with  . Sore Throat     (Consider location/radiation/quality/duration/timing/severity/associated sxs/prior Treatment) Patient is a 28 y.o. male presenting with pharyngitis.  Sore Throat Associated symptoms include a sore throat. Pertinent negatives include no abdominal pain, arthralgias, chest pain, chills, congestion, coughing, fever, headaches, nausea, neck pain, numbness, rash or vomiting.    ACHILLES NEVILLE is a 28 y.o. male who presents to the Emergency Department complaining of sore throat for three days.  He states reports having generalized body aches, chills, fever and mild bilateral ear pain.  He states the pain is worse with swallowing.  He has tried tylenol, ibuprofen and over the counter throat spray without relief.   He denies facial pain, headache, cough, neck pain, vomiting or diarrhea.     History reviewed. No pertinent past medical history. Past Surgical History  Procedure Laterality Date  . Hip surgery    . Knee surgery     No family history on file. History  Substance Use Topics  . Smoking status: Current Every Day Smoker -- 1.00 packs/day  . Smokeless tobacco: Not on file  . Alcohol Use: No    Review of Systems  Constitutional: Negative for fever, chills, activity change and appetite change.  HENT: Positive for ear pain and sore throat. Negative for congestion, facial swelling, sinus pressure, trouble swallowing and voice change.   Eyes: Negative for pain and visual disturbance.  Respiratory: Negative for cough and shortness of breath.   Cardiovascular: Negative for chest pain.  Gastrointestinal: Negative for nausea, vomiting and abdominal pain.  Musculoskeletal: Negative for arthralgias, neck pain and neck stiffness.  Skin: Negative for color change and rash.  Neurological: Negative for dizziness,  facial asymmetry, speech difficulty, numbness and headaches.  Hematological: Negative for adenopathy.  All other systems reviewed and are negative.     Allergies  Review of patient's allergies indicates no known allergies.  Home Medications   Prior to Admission medications   Medication Sig Start Date End Date Taking? Authorizing Provider  amoxicillin (AMOXIL) 500 MG capsule Take 1 capsule (500 mg total) by mouth 3 (three) times daily. 03/04/13   Kathie Dike, PA-C  azithromycin (ZITHROMAX) 250 MG tablet Take two tablets on day one, then one tab qd days 2-5 08/31/13   Aldon Hengst L. Reah Justo, PA-C  guaiFENesin-codeine (ROBITUSSIN AC) 100-10 MG/5ML syrup Take 10 mLs by mouth 3 (three) times daily as needed for cough. 08/31/13   Abriana Saltos L. Wirt Hemmerich, PA-C  HYDROcodone-acetaminophen (NORCO) 5-325 MG per tablet Take 1 tablet by mouth every 4 (four) hours as needed for moderate pain. 03/04/13   Kathie Dike, PA-C  HYDROcodone-acetaminophen (NORCO/VICODIN) 5-325 MG per tablet Take 1 tablet by mouth every 4 (four) hours as needed. 10/01/13   Kathie Dike, PA-C  ibuprofen (ADVIL,MOTRIN) 800 MG tablet Take 1 tablet (800 mg total) by mouth 3 (three) times daily. 03/04/13   Kathie Dike, PA-C  ibuprofen (ADVIL,MOTRIN) 800 MG tablet Take 1 tablet (800 mg total) by mouth 3 (three) times daily. 10/01/13   Kathie Dike, PA-C  methocarbamol (ROBAXIN) 500 MG tablet Take 1 tablet (500 mg total) by mouth 3 (three) times daily. 10/01/13   Kathie Dike, PA-C   BP 138/83  Pulse 87  Temp(Src) 99.1 F (37.3 C) (Oral)  Resp 20  Ht 5'  10" (1.778 m)  Wt 210 lb (95.255 kg)  BMI 30.13 kg/m2  SpO2 97% Physical Exam  Nursing note and vitals reviewed. Constitutional: He is oriented to person, place, and time. He appears well-developed and well-nourished. No distress.  HENT:  Head: Normocephalic and atraumatic.  Mouth/Throat: Uvula is midline and mucous membranes are normal. No trismus in the jaw. No uvula  swelling. Posterior oropharyngeal edema and posterior oropharyngeal erythema present. No oropharyngeal exudate or tonsillar abscesses.  Mild air fluid levels to the right TM.  No erythema or bulging.  Left TM appears nml.  Edema and erythema of the bilateral tonsils.  No exudates.  Few small ulcerations to the soft palate.    Neck: Normal range of motion, full passive range of motion without pain and phonation normal. Neck supple. No Kernig's sign noted.  Cardiovascular: Normal rate, regular rhythm, normal heart sounds and intact distal pulses.   No murmur heard. Pulmonary/Chest: Effort normal and breath sounds normal. No respiratory distress.  Musculoskeletal: Normal range of motion.  Lymphadenopathy:    He has no cervical adenopathy.  Neurological: He is alert and oriented to person, place, and time. He exhibits normal muscle tone. Coordination normal.  Skin: Skin is warm and dry. No rash noted.    ED Course  Procedures (including critical care time) Labs Review Labs Reviewed - No data to display  Imaging Review No results found.   EKG Interpretation None      MDM   Final diagnoses:  Tonsillitis    Pt is well appearing.  Airway is patent.  Possible tonsillitis vs viral illness.  Patient agrees to treatment with magic mouthwash and amoxil.  He was given single dose of hycet and initial antibiotic dose in the dept.  He appears stable for d/c and agrees to plan.  Advised to return here if needed or f/u with his PMD.      Leafy Motsinger L. [place], PA-C 12/21/13 0018

## 2013-12-21 NOTE — Discharge Instructions (Signed)
Tonsillitis °Tonsillitis is an infection of the throat. This infection causes the tonsils to become red, tender, and puffy (swollen). Tonsils are groups of tissue at the back of your throat. If bacteria caused your infection, antibiotic medicine will be given to you. Sometimes symptoms of tonsillitis can be relieved with the use of steroid medicine. If your tonsillitis is severe and happens often, you may need to get your tonsils removed (tonsillectomy). °HOME CARE  °· Rest and sleep often. °· Drink enough fluids to keep your pee (urine) clear or pale yellow. °· While your throat is sore, eat soft or liquid foods like: °¨ Soup. °¨ Ice cream. °¨ Instant breakfast drinks. °· Eat frozen ice pops. °· Gargle with a warm or cold liquid to help soothe the throat. Gargle with a water and salt mix. Mix 1/4 teaspoon of salt and 1/4 teaspoon of baking soda in 1 cup of water. °· Only take medicines as told by your doctor. °· If you are given medicines (antibiotics), take them as told. Finish them even if you start to feel better. °GET HELP IF: °· You have large, tender lumps in your neck. °· You have a rash. °· You cough up green, yellow-brown, or bloody fluid. °· You cannot swallow liquids or food for 24 hours. °· You notice that only one of your tonsils is swollen. °GET HELP RIGHT AWAY IF:  °· You throw up (vomit). °· You have a very bad headache. °· You have a stiff neck. °· You have chest pain. °· You have trouble breathing or swallowing. °· You have bad throat pain, drooling, or your voice changes. °· You have bad pain not helped by medicine. °· You cannot fully open your mouth. °· You have redness, puffiness, or bad pain in the neck. °· You have a fever. °MAKE SURE YOU:  °· Understand these instructions. °· Will watch your condition. °· Will get help right away if you are not doing well or get worse. °Document Released: 08/27/2007 Document Revised: 03/15/2013 Document Reviewed: 08/27/2012 °ExitCare® Patient Information  ©2015 [employer], [employer]. This information is not intended to replace advice given to you by your health care provider. Make sure you discuss any questions you have with your health care provider. ° °

## 2015-03-02 ENCOUNTER — Encounter (HOSPITAL_COMMUNITY): Payer: Self-pay | Admitting: Emergency Medicine

## 2015-03-02 ENCOUNTER — Emergency Department (HOSPITAL_COMMUNITY)
Admission: EM | Admit: 2015-03-02 | Discharge: 2015-03-02 | Disposition: A | Discharge status: Home / Self Care | Payer: Worker's Compensation | Attending: Emergency Medicine | Admitting: Emergency Medicine

## 2015-03-02 ENCOUNTER — Emergency Department (HOSPITAL_COMMUNITY): Payer: Worker's Compensation

## 2015-03-02 DIAGNOSIS — S62609A Fracture of unspecified phalanx of unspecified finger, initial encounter for closed fracture: Secondary | ICD-10-CM

## 2015-03-02 DIAGNOSIS — F17200 Nicotine dependence, unspecified, uncomplicated: Secondary | ICD-10-CM | POA: Diagnosis not present

## 2015-03-02 DIAGNOSIS — Y9389 Activity, other specified: Secondary | ICD-10-CM | POA: Diagnosis not present

## 2015-03-02 DIAGNOSIS — Y9289 Other specified places as the place of occurrence of the external cause: Secondary | ICD-10-CM | POA: Diagnosis not present

## 2015-03-02 DIAGNOSIS — S62622A Displaced fracture of medial phalanx of right middle finger, initial encounter for closed fracture: Secondary | ICD-10-CM | POA: Insufficient documentation

## 2015-03-02 DIAGNOSIS — Y998 Other external cause status: Secondary | ICD-10-CM | POA: Diagnosis not present

## 2015-03-02 DIAGNOSIS — W231XXA Caught, crushed, jammed, or pinched between stationary objects, initial encounter: Secondary | ICD-10-CM | POA: Diagnosis not present

## 2015-03-02 DIAGNOSIS — S6991XA Unspecified injury of right wrist, hand and finger(s), initial encounter: Secondary | ICD-10-CM | POA: Diagnosis present

## 2015-03-02 MED ORDER — IBUPROFEN 600 MG PO TABS: 600.0000 mg | ORAL_TABLET | Freq: Four times a day (QID) | ORAL | Status: AC | PRN

## 2015-03-02 MED ORDER — HYDROCODONE-ACETAMINOPHEN 5-325 MG PO TABS: 1.0000 | ORAL_TABLET | ORAL | Status: DC | PRN

## 2015-03-02 NOTE — Discharge Instructions (Signed)
Finger Fracture  Fractures of fingers are breaks in the bones of the fingers. There are many types of fractures. There are different ways of treating these fractures. Your health care provider will discuss the best way to treat your fracture.  CAUSES  Traumatic injury is the main cause of broken fingers. These include:  · Injuries while playing sports.  · Workplace injuries.  · Falls.  RISK FACTORS  Activities that can increase your risk of finger fractures include:  · Sports.  · Workplace activities that involve machinery.  · A condition called osteoporosis, which can make your bones less dense and cause them to fracture more easily.  SIGNS AND SYMPTOMS  The main symptoms of a broken finger are pain and swelling within 15 minutes after the injury. Other symptoms include:  · Bruising of your finger.  · Stiffness of your finger.  · Numbness of your finger.  · Exposed bones (compound fracture) if the fracture is severe.  DIAGNOSIS   The best way to diagnose a broken bone is with X-ray imaging. Additionally, your health care provider will use this X-ray image to evaluate the position of the broken finger bones.   TREATMENT   Finger fractures can be treated with:   · Nonreduction--This means the bones are in place. The finger is splinted without changing the positions of the bone pieces. The splint is usually left on for about a week to 10 days. This will depend on your fracture and what your health care provider thinks.  · Closed reduction--The bones are put back into position without using surgery. The finger is then splinted.  · Open reduction and internal fixation--The fracture site is opened. Then the bone pieces are fixed into place with pins or some type of hardware. This is seldom required. It depends on the severity of the fracture.  HOME CARE INSTRUCTIONS   · Follow your health care provider's instructions regarding activities, exercises, and physical therapy.  · Only take over-the-counter or prescription  medicines for pain, discomfort, or fever as directed by your health care provider.  SEEK MEDICAL CARE IF:  You have pain or swelling that limits the motion or use of your fingers.  SEEK IMMEDIATE MEDICAL CARE IF:   Your finger becomes numb.  MAKE SURE YOU:   · Understand these instructions.  · Will watch your condition.  · Will get help right away if you are not doing well or get worse.     This information is not intended to replace advice given to you by your health care provider. Make sure you discuss any questions you have with your health care provider.     Document Released: 06/22/2000 Document Revised: 12/29/2012 Document Reviewed: 10/20/2012  Elsevier Interactive Patient Education ©2016 [employer]

## 2015-03-02 NOTE — ED Notes (Signed)
Pt states he tripped jamming middle finger. Pt has good sensation & cap refill. No deformity noted. Had pt remove ring from that hand & he put it in his pocket.

## 2015-03-02 NOTE — ED Notes (Signed)
Pt states he fell tonight a jammed his middle on right hand into a tractor tire. Painful, swollen, skin intact

## 2015-03-02 NOTE — ED Notes (Signed)
Pt alert & oriented x4, stable gait. Patient given discharge instructions, paperwork & prescription(s). Patient informed not to drive, operate any equipment & handel any important documents 4 hours after taking pain medication. Patient  instructed to stop at the registration desk to finish any additional paperwork. Patient  verbalized understanding. Pt left department w/ no further questions. 

## 2015-03-04 NOTE — ED Provider Notes (Signed)
CSN: [id number]     Arrival date & time 03/02/15  1936 History   First MD Initiated Contact with Patient 03/02/15 2015     Chief Complaint  Patient presents with  . Finger Injury     (Consider location/radiation/quality/duration/timing/severity/associated sxs/prior Treatment) The history is provided by the patient.   Spencer Robinson is a 29 y.o.right handed male  Presenting with pain and swelling to his right long finger since tripping and "jamming" the finger against a tractor tire prior to arrival today. He denies numbness distal to the injury site and there is no radiation of pain beyond the finger.  He has had no treatment prior to arrival.      History reviewed. No pertinent past medical history. Past Surgical History  Procedure Laterality Date  . Hip surgery    . Knee surgery     No family history on file. Social History  Substance Use Topics  . Smoking status: Current Every Day Smoker -- 1.00 packs/day  . Smokeless tobacco: None  . Alcohol Use: No    Review of Systems  Constitutional: Negative for fever.  Musculoskeletal: Positive for joint swelling and arthralgias. Negative for myalgias.  Skin: Negative for color change and wound.  Neurological: Negative for weakness and numbness.      Allergies  Review of patient's allergies indicates no known allergies.  Home Medications   Prior to Admission medications   Medication Sig Start Date End Date Taking? Authorizing Provider  HYDROcodone-acetaminophen (NORCO/VICODIN) 5-325 MG tablet Take 1 tablet by mouth every 4 (four) hours as needed. 03/02/15   Burgess Amor, PA-C  ibuprofen (ADVIL,MOTRIN) 600 MG tablet Take 1 tablet (600 mg total) by mouth every 6 (six) hours as needed. 03/02/15   Burgess Amor, PA-C   BP 126/69 mmHg  Pulse 92  Temp(Src) 98 F (36.7 C) (Oral)  Resp 16  Ht  (1.778 m)  Wt 117.935 kg  BMI 37.31 kg/m2  SpO2 98% Physical Exam  Constitutional: He appears well-developed and well-nourished.    HENT:  Head: Atraumatic.  Neck: Normal range of motion.  Cardiovascular:  Pulses equal bilaterally  Musculoskeletal: He exhibits edema and tenderness.       Hands: Neurological: He is alert. He has normal strength. He displays normal reflexes. No sensory deficit.  Skin: Skin is warm and dry.  Psychiatric: He has a normal mood and affect.    ED Course  Procedures (including critical care time) Labs Review Labs Reviewed - No data to display  Imaging Review Dg Hand Complete Right  03/02/2015  CLINICAL DATA:  Pain following fall. EXAM: RIGHT HAND - COMPLETE 3+ VIEW COMPARISON:  None. FINDINGS: Frontal, oblique, and lateral views were obtained. There is generalized soft tissue swelling. There is a small avulsion arising from the volar aspect of the proximal most aspect of the third middle phalanx. No other fracture. No dislocation. Joint spaces appear intact. IMPRESSION: Small avulsion arising from the volar aspect of the proximal most aspect of the third middle phalanx. No other fracture. No dislocation. No appreciable joint space narrowing. There is generalized soft tissue swelling. Electronically Signed   By: Bretta Bang III M.D.   On: 03/02/2015 20:19   I have personally reviewed and evaluated these images and lab results as part of my medical decision-making.   EKG Interpretation None      MDM   Final diagnoses:  Finger fracture, right, closed, initial encounter     Radiological studies were viewed, interpreted and considered  during the medical decision making and disposition process. I agree with radiologists reading.  Results were also discussed with patient. Pt placed in finger splint, advised ice, elevation. Ibuprofen and hydrocodone prescribed. Ortho f/u recommended, given referral to Dr. Romeo AppleHarrison or Dr. Hilda LiasKeeling.  Pt to call for appt early this week for recheck.  Pt understands and agrees with plan.      Burgess AmorJulie Suzzane Quilter, PA-C 03/04/15 1338  Raeford RazorStephen Kohut, MD 03/04/15  [phone]

## 2015-07-19 ENCOUNTER — Ambulatory Visit (INDEPENDENT_AMBULATORY_CARE_PROVIDER_SITE_OTHER): Payer: BLUE CROSS/BLUE SHIELD | Admitting: Otolaryngology

## 2015-07-19 DIAGNOSIS — J310 Chronic rhinitis: Secondary | ICD-10-CM | POA: Diagnosis not present

## 2015-07-19 DIAGNOSIS — J342 Deviated nasal septum: Secondary | ICD-10-CM | POA: Diagnosis not present

## 2015-07-19 DIAGNOSIS — J343 Hypertrophy of nasal turbinates: Secondary | ICD-10-CM

## 2015-08-16 ENCOUNTER — Ambulatory Visit (INDEPENDENT_AMBULATORY_CARE_PROVIDER_SITE_OTHER): Payer: Self-pay | Admitting: Otolaryngology

## 2016-02-19 ENCOUNTER — Encounter: Payer: Self-pay | Admitting: Internal Medicine

## 2016-03-04 ENCOUNTER — Ambulatory Visit: Payer: BLUE CROSS/BLUE SHIELD | Admitting: Nurse Practitioner

## 2019-05-12 ENCOUNTER — Emergency Department (HOSPITAL_COMMUNITY)
Admission: EM | Admit: 2019-05-12 | Discharge: 2019-05-12 | Disposition: A | Discharge status: Home / Self Care | Payer: BLUE CROSS/BLUE SHIELD | Attending: Emergency Medicine | Admitting: Emergency Medicine

## 2019-05-12 ENCOUNTER — Other Ambulatory Visit: Payer: Self-pay

## 2019-05-12 ENCOUNTER — Encounter (HOSPITAL_COMMUNITY): Payer: Self-pay

## 2019-05-12 DIAGNOSIS — F17210 Nicotine dependence, cigarettes, uncomplicated: Secondary | ICD-10-CM | POA: Insufficient documentation

## 2019-05-12 DIAGNOSIS — K047 Periapical abscess without sinus: Secondary | ICD-10-CM | POA: Insufficient documentation

## 2019-05-12 MED ORDER — PENICILLIN V POTASSIUM 250 MG PO TABS
500.0000 mg | ORAL_TABLET | Freq: Once | ORAL | Status: AC
  Administered 2019-05-12: 500 mg via ORAL
  Filled 2019-05-12: qty 2

## 2019-05-12 MED ORDER — HYDROCODONE-ACETAMINOPHEN 5-325 MG PO TABS: ORAL_TABLET | ORAL | 0 refills | Status: DC

## 2019-05-12 MED ORDER — PENICILLIN V POTASSIUM 500 MG PO TABS: 500.0000 mg | ORAL_TABLET | Freq: Three times a day (TID) | ORAL | 0 refills | Status: DC

## 2019-05-12 MED ORDER — HYDROCODONE-ACETAMINOPHEN 5-325 MG PO TABS: 1.0000 | ORAL_TABLET | ORAL | 0 refills | Status: DC | PRN

## 2019-05-12 NOTE — ED Provider Notes (Signed)
[facility] EMERGENCY DEPARTMENT Provider Note   CSN: [id number] Arrival date & time: 05/12/19  1900     History Chief Complaint  Patient presents with  . Dental Pain    Spencer Robinson is a 34 y.o. male.  HPI      Spencer Robinson is a 34 y.o. male who presents to the Emergency Department complaining of right upper dental pain worsening for 2 days. Has hx of dental decay and avulsed tooth for months.  Pain gradually worsening.  He denies facial swelling, fever, nausea, vomiting, neck pain or difficulty swallowing.      History reviewed. No pertinent past medical history.  Patient Active Problem List   Diagnosis Date Noted  . OBESITY 07/15/2007  . TOBACCO ABUSE 07/15/2007  . ALLERGIC RHINITIS 07/15/2007  . GERD 07/15/2007    Past Surgical History:  Procedure Laterality Date  . HIP SURGERY    . KNEE SURGERY         No family history on file.  Social History   Tobacco Use  . Smoking status: Current Every Day Smoker    Packs/day: 1.00  . Smokeless tobacco: Never Used  Substance Use Topics  . Alcohol use: No  . Drug use: No    Home Medications Prior to Admission medications   Medication Sig Start Date End Date Taking? Authorizing Provider  HYDROcodone-acetaminophen (NORCO/VICODIN) 5-325 MG tablet Take 1 tablet by mouth every 4 (four) hours as needed. 05/12/19   Bassam Dresch, PA-C  HYDROcodone-acetaminophen (NORCO/VICODIN) 5-325 MG tablet Take one tab po q 4 hrs prn pain 05/12/19   Audrielle Vankuren, PA-C  ibuprofen (ADVIL,MOTRIN) 600 MG tablet Take 1 tablet (600 mg total) by mouth every 6 (six) hours as needed. 03/02/15   Burgess Amor, PA-C  penicillin v potassium (VEETID) 500 MG tablet Take 1 tablet (500 mg total) by mouth 3 (three) times daily. 05/12/19   Kween Bacorn, PA-C  gabapentin (NEURONTIN) 300 MG capsule Take 300 mg by mouth 3 (three) times daily.  11/19/12  [provider]    Allergies    Patient has no known allergies.  Review of  Systems   Review of Systems  Constitutional: Negative for appetite change and fever.  HENT: Positive for dental problem. Negative for congestion, facial swelling, sore throat and trouble swallowing.   Eyes: Negative for pain and visual disturbance.  Respiratory: Negative for shortness of breath.   Cardiovascular: Negative for chest pain.  Gastrointestinal: Negative for abdominal pain, nausea and vomiting.  Musculoskeletal: Negative for neck pain and neck stiffness.  Neurological: Negative for dizziness, facial asymmetry and headaches.  Hematological: Negative for adenopathy.    Physical Exam Updated Vital Signs BP (!) 141/79 (BP Location: Right Arm)   Pulse 87   Temp 98.3 F (36.8 C) (Oral)   Resp 16   Ht 5\' 10"  (1.778 m)   Wt 117.9 kg   SpO2 98%   BMI 37.31 kg/m   Physical Exam Vitals and nursing note reviewed.  Constitutional:      General: He is not in acute distress.    Appearance: He is well-developed. He is not ill-appearing.  HENT:     Head: Atraumatic.     Jaw: No trismus.     Right Ear: Tympanic membrane and ear canal normal.     Left Ear: Tympanic membrane and ear canal normal.     Mouth/Throat:     Mouth: Mucous membranes are moist.     Dentition: Dental caries present. No  dental abscesses.     Pharynx: Oropharynx is clear. Uvula midline. No uvula swelling.     Comments: ttp of right upper bicuspid.  Focal swelling of the adjacent gingiva with moderate fluctuance noted. No facial edema noted.  Uvula midline and non edematous.  No trismus Cardiovascular:     Rate and Rhythm: Normal rate and regular rhythm.     Heart sounds: Normal heart sounds. No murmur.  Pulmonary:     Effort: Pulmonary effort is normal.     Breath sounds: Normal breath sounds.  Musculoskeletal:        General: Normal range of motion.     Cervical back: Normal range of motion and neck supple.  Lymphadenopathy:     Cervical: No cervical adenopathy.  Skin:    General: Skin is warm and  dry.  Neurological:     Mental Status: He is alert and oriented to person, place, and time.     Motor: No abnormal muscle tone.     Coordination: Coordination normal.     ED Results / Procedures / Treatments   Labs (all labs ordered are listed, but only abnormal results are displayed) Labs Reviewed - No data to display  EKG None  Radiology No results found.  Procedures Procedures (including critical care time)  INCISION AND DRAINAGE Performed by: Amesha Bailey Consent: Verbal consent obtained. Risks and benefits: risks, benefits and alternatives were discussed Type: abscess  Body area: right upper gum  Anesthesia: topical spray  Stab Incision was made with a #11 scalpel.  Local anesthetic: Benzocaine spray  Anesthetic total: 2 ml  Complexity: simple Blunt dissection to break up loculations  Drainage: purulent  Drainage amount: moderate  Packing material: none  Patient tolerance: Patient tolerated the procedure well with no immediate complications.     Medications Ordered in ED Medications  penicillin v potassium (VEETID) tablet 500 mg (has no administration in time range)    ED Course  I have reviewed the triage vital signs and the nursing notes.  Pertinent labs & imaging results that were available during my care of the patient were reviewed by me and considered in my medical decision making (see chart for details).    MDM Rules/Calculators/A&P                      Pt with localized dental abscess.  He is well appearing. No trismus or sx's concerning for Ludwig's angina.  Pain improved.  Pt agrees to abx, salt water rinses and close dental f/u.  Return precautions discussed.    Final Clinical Impression(s) / ED Diagnoses Final diagnoses:  Dental abscess    Rx / DC Orders ED Discharge Orders         Ordered    HYDROcodone-acetaminophen (NORCO/VICODIN) 5-325 MG tablet  Every 4 hours PRN     05/12/19 2014    penicillin v potassium (VEETID)  500 MG tablet  3 times daily     05/12/19 2016    HYDROcodone-acetaminophen (NORCO/VICODIN) 5-325 MG tablet     05/12/19 2016           Kem Parkinson, PA-C 05/14/19 0009    Lucrezia Starch, MD 05/15/19 [phone]

## 2019-05-12 NOTE — ED Triage Notes (Signed)
Pt reports right upper dental pain- pt has swollen area, appears to be abscess present inside right upper mouth. Pt tooth broke about 2 months ago and has gotten worse in the past 2 days. Pt has taken tylenol for pain- helped a little.

## 2019-05-12 NOTE — Discharge Instructions (Addendum)
Continue warm salt water rinses several times a day.  Take the antibiotic as directed until its finished.  You will need to see a dentist soon.  Return to the emergency department if you develop any worsening symptoms such as facial swelling, fever, or vomiting

## 2019-05-13 MED FILL — Hydrocodone-Acetaminophen Tab 5-325 MG: ORAL | Qty: 6 | Status: AC

## 2020-02-22 ENCOUNTER — Encounter (HOSPITAL_COMMUNITY): Payer: Self-pay | Admitting: Emergency Medicine

## 2020-02-22 ENCOUNTER — Emergency Department (HOSPITAL_COMMUNITY): Payer: Self-pay

## 2020-02-22 ENCOUNTER — Other Ambulatory Visit: Payer: Self-pay

## 2020-02-22 DIAGNOSIS — J069 Acute upper respiratory infection, unspecified: Secondary | ICD-10-CM | POA: Insufficient documentation

## 2020-02-22 DIAGNOSIS — F17200 Nicotine dependence, unspecified, uncomplicated: Secondary | ICD-10-CM | POA: Insufficient documentation

## 2020-02-22 DIAGNOSIS — Z20822 Contact with and (suspected) exposure to covid-19: Secondary | ICD-10-CM | POA: Insufficient documentation

## 2020-02-22 NOTE — ED Triage Notes (Signed)
Pt c/o a deep cough since Sunday. Pt denies fevers or any other symptoms.

## 2020-02-23 ENCOUNTER — Emergency Department (HOSPITAL_COMMUNITY)
Admission: EM | Admit: 2020-02-23 | Discharge: 2020-02-23 | Disposition: A | Discharge status: Home / Self Care | Payer: Self-pay | Attending: Emergency Medicine | Admitting: Emergency Medicine

## 2020-02-23 DIAGNOSIS — J069 Acute upper respiratory infection, unspecified: Secondary | ICD-10-CM

## 2020-02-23 LAB — RESP PANEL BY RT-PCR (FLU A&B, COVID) ARPGX2
Influenza A by PCR: NEGATIVE
Influenza B by PCR: NEGATIVE
SARS Coronavirus 2 by RT PCR: NEGATIVE

## 2020-02-23 MED ORDER — PREDNISONE 50 MG PO TABS: 50.0000 mg | ORAL_TABLET | Freq: Every day | ORAL | 0 refills | Status: AC

## 2020-02-23 MED ORDER — BENZONATATE 100 MG PO CAPS
200.0000 mg | ORAL_CAPSULE | Freq: Once | ORAL | Status: AC
  Administered 2020-02-23: 200 mg via ORAL
  Filled 2020-02-23: qty 2

## 2020-02-23 MED ORDER — ALBUTEROL SULFATE HFA 108 (90 BASE) MCG/ACT IN AERS
2.0000 | INHALATION_SPRAY | Freq: Once | RESPIRATORY_TRACT | Status: AC
  Administered 2020-02-23: 2 via RESPIRATORY_TRACT
  Filled 2020-02-23: qty 6.7

## 2020-02-23 MED ORDER — PREDNISONE 50 MG PO TABS
60.0000 mg | ORAL_TABLET | Freq: Once | ORAL | Status: AC
  Administered 2020-02-23: 60 mg via ORAL
  Filled 2020-02-23: qty 1

## 2020-02-23 MED ORDER — BENZONATATE 100 MG PO CAPS: 100.0000 mg | ORAL_CAPSULE | Freq: Three times a day (TID) | ORAL | 0 refills | Status: AC

## 2020-02-23 NOTE — ED Provider Notes (Signed)
[facility] EMERGENCY DEPARTMENT Provider Note   CSN: [id number] Arrival date & time: 02/22/20  2142   History Chief Complaint  Patient presents with   Cough    Spencer Robinson is a 34 y.o. male.  The history is provided by the patient.  Cough He states that he started having what he describes as a head cold about 4 days ago.  He was sneezing and had some rhinorrhea, but no fever.  2 days ago, the sneezing and rhinorrhea subsided but he started having a cough which is occasionally productive of some clear sputum.  Yesterday, he started having a burning pain in his chest when he coughed.  He has dyspnea only with coughing paroxysms.  He denies fever or chills.  He denies arthralgias or myalgias.  He denies any sick contacts.  He has not been vaccinated against COVID-19 and has not received the influenza vaccine yet.  He is a cigarette smoker.  Symptoms are similar to what he had with bronchitis several years ago.  History reviewed. No pertinent past medical history.  Patient Active Problem List   Diagnosis Date Noted   OBESITY 07/15/2007   TOBACCO ABUSE 07/15/2007   ALLERGIC RHINITIS 07/15/2007   GERD 07/15/2007    Past Surgical History:  Procedure Laterality Date   HIP SURGERY     KNEE SURGERY         History reviewed. No pertinent family history.  Social History   Tobacco Use   Smoking status: Current Every Day Smoker    Packs/day: 1.00   Smokeless tobacco: Never Used  Vaping Use   Vaping Use: Never used  Substance Use Topics   Alcohol use: No   Drug use: No    Home Medications Prior to Admission medications   Medication Sig Start Date End Date Taking? Authorizing Provider  HYDROcodone-acetaminophen (NORCO/VICODIN) 5-325 MG tablet Take 1 tablet by mouth every 4 (four) hours as needed. 05/12/19   Triplett, Tammy, PA-C  HYDROcodone-acetaminophen (NORCO/VICODIN) 5-325 MG tablet Take one tab po q 4 hrs prn pain 05/12/19   Triplett, Tammy, PA-C    ibuprofen (ADVIL,MOTRIN) 600 MG tablet Take 1 tablet (600 mg total) by mouth every 6 (six) hours as needed. 03/02/15   Burgess Amor, PA-C  penicillin v potassium (VEETID) 500 MG tablet Take 1 tablet (500 mg total) by mouth 3 (three) times daily. 05/12/19   Triplett, Tammy, PA-C  gabapentin (NEURONTIN) 300 MG capsule Take 300 mg by mouth 3 (three) times daily.  11/19/12  [provider]    Allergies    Patient has no known allergies.  Review of Systems   Review of Systems  Respiratory: Positive for cough.   All other systems reviewed and are negative.   Physical Exam Updated Vital Signs BP (!) 155/94 (BP Location: Right Arm)    Pulse 77    Temp 98.2 F (36.8 C) (Oral)    Resp 18    Ht 5\' 10"  (1.778 m)    Wt 127.9 kg    SpO2 98%    BMI 40.46 kg/m   Physical Exam Vitals and nursing note reviewed.   Obese 34 year old male, resting comfortably and in no acute distress. Vital signs are significant for elevated blood pressure. Oxygen saturation is 98%, which is normal. Head is normocephalic and atraumatic. PERRLA, EOMI. Oropharynx is clear. Neck is nontender and supple without adenopathy or JVD. Back is nontender and there is no CVA tenderness. Lungs are clear without rales, wheezes, or  rhonchi. Chest is nontender. Heart has regular rate and rhythm without murmur. Abdomen is soft, flat, nontender without masses or hepatosplenomegaly and peristalsis is normoactive. Extremities have no cyanosis or edema, full range of motion is present. Skin is warm and dry without rash. Neurologic: Mental status is normal, cranial nerves are intact, there are no motor or sensory deficits.  ED Results / Procedures / Treatments   Labs (all labs ordered are listed, but only abnormal results are displayed) Labs Reviewed  RESP PANEL BY RT-PCR (FLU A&B, COVID) [id number]   Radiology DG Chest 2 View  Result Date: 02/22/2020 CLINICAL DATA:  Cough, chest congestion, central chest pain for 3 days EXAM:  CHEST - 2 VIEW COMPARISON:  11/19/2012 FINDINGS: Frontal and lateral views of the chest demonstrate an unremarkable cardiac silhouette. No acute airspace disease, effusion, or pneumothorax. Interstitial prominence consistent with history of tobacco abuse. IMPRESSION: 1. No acute intrathoracic process. Electronically Signed   By: Sharlet Salina M.D.   On: 02/22/2020 23:24    Procedures Procedures  Medications Ordered in ED Medications  predniSONE (DELTASONE) tablet 60 mg (has no administration in time range)  benzonatate (TESSALON) capsule 200 mg (has no administration in time range)  albuterol (VENTOLIN HFA) 108 (90 Base) MCG/ACT inhaler 2 puff (2 puffs Inhalation Given 02/23/20 0140)    ED Course  I have reviewed the triage vital signs and the nursing notes.  Pertinent labs & imaging results that were available during my care of the patient were reviewed by me and considered in my medical decision making (see chart for details).  MDM Rules/Calculators/A&P Cough and burning chest pain which appeared to be secondary to acute bronchitis.  Chest x-ray shows no evidence of pneumonia.  Nasal swab has been sent for respiratory pathogens.  He will be given a therapeutic trial of albuterol via inhaler.  Old records are reviewed confirming prior ED visits for viral URIs.  Respiratory pathogens are negative.  He did notice some improvement with albuterol and is advised to continue using albuterol inhaler as needed.  He is requesting some medication for his cough and is given a prescription for benzonatate and advised to use over-the-counter dextromethorphan as needed.  Also given prescription for short course of prednisone.  Return precautions discussed.  Spencer Robinson was evaluated in Emergency Department on 02/23/2020 for the symptoms described in the history of present illness. He was evaluated in the context of the global COVID-19 pandemic, which necessitated consideration that the patient might  be at risk for infection with the SARS-CoV-2 virus that causes COVID-19. Institutional protocols and algorithms that pertain to the evaluation of patients at risk for COVID-19 are in a state of rapid change based on information released by regulatory bodies including the CDC and federal and state organizations. These policies and algorithms were followed during the patient's care in the ED.  Final Clinical Impression(s) / ED Diagnoses Final diagnoses:  Viral URI with cough    Rx / DC Orders ED Discharge Orders         Ordered    benzonatate (TESSALON) 100 MG capsule  Every 8 hours        02/23/20 0232    predniSONE (DELTASONE) 50 MG tablet  Daily        02/23/20 0232           Dione Booze, MD 02/23/20 0234

## 2020-02-23 NOTE — Discharge Instructions (Addendum)
Please get vaccinated for COVID-19.  Use your inhaler as needed.  Use 2 puffs, every 4 hours to help with cough or difficulty breathing.

## 2020-04-11 ENCOUNTER — Emergency Department (HOSPITAL_COMMUNITY)
Admission: EM | Admit: 2020-04-11 | Discharge: 2020-04-11 | Disposition: A | Discharge status: Home / Self Care | Payer: Self-pay | Attending: Emergency Medicine | Admitting: Emergency Medicine

## 2020-04-11 ENCOUNTER — Other Ambulatory Visit: Payer: Self-pay

## 2020-04-11 DIAGNOSIS — Z5321 Procedure and treatment not carried out due to patient leaving prior to being seen by health care provider: Secondary | ICD-10-CM | POA: Insufficient documentation

## 2020-04-11 DIAGNOSIS — R519 Headache, unspecified: Secondary | ICD-10-CM | POA: Insufficient documentation

## 2020-04-11 DIAGNOSIS — R6883 Chills (without fever): Secondary | ICD-10-CM | POA: Insufficient documentation

## 2020-04-11 DIAGNOSIS — Z20822 Contact with and (suspected) exposure to covid-19: Secondary | ICD-10-CM | POA: Insufficient documentation

## 2020-04-11 NOTE — ED Triage Notes (Signed)
Pt c/o headache, body aches, and chills yesterday but feels fine today. Pt took an at home covid test which was negative and wanted to checked here "to be sure."

## 2021-02-19 IMAGING — DX DG CHEST 2V
2 series · 2 of 2 positions shown · non-contrast
Comparison: 11/19/2012

CLINICAL DATA: Cough, chest congestion, central chest pain for 3
days

EXAM:
CHEST - 2 VIEW

[chest pa]
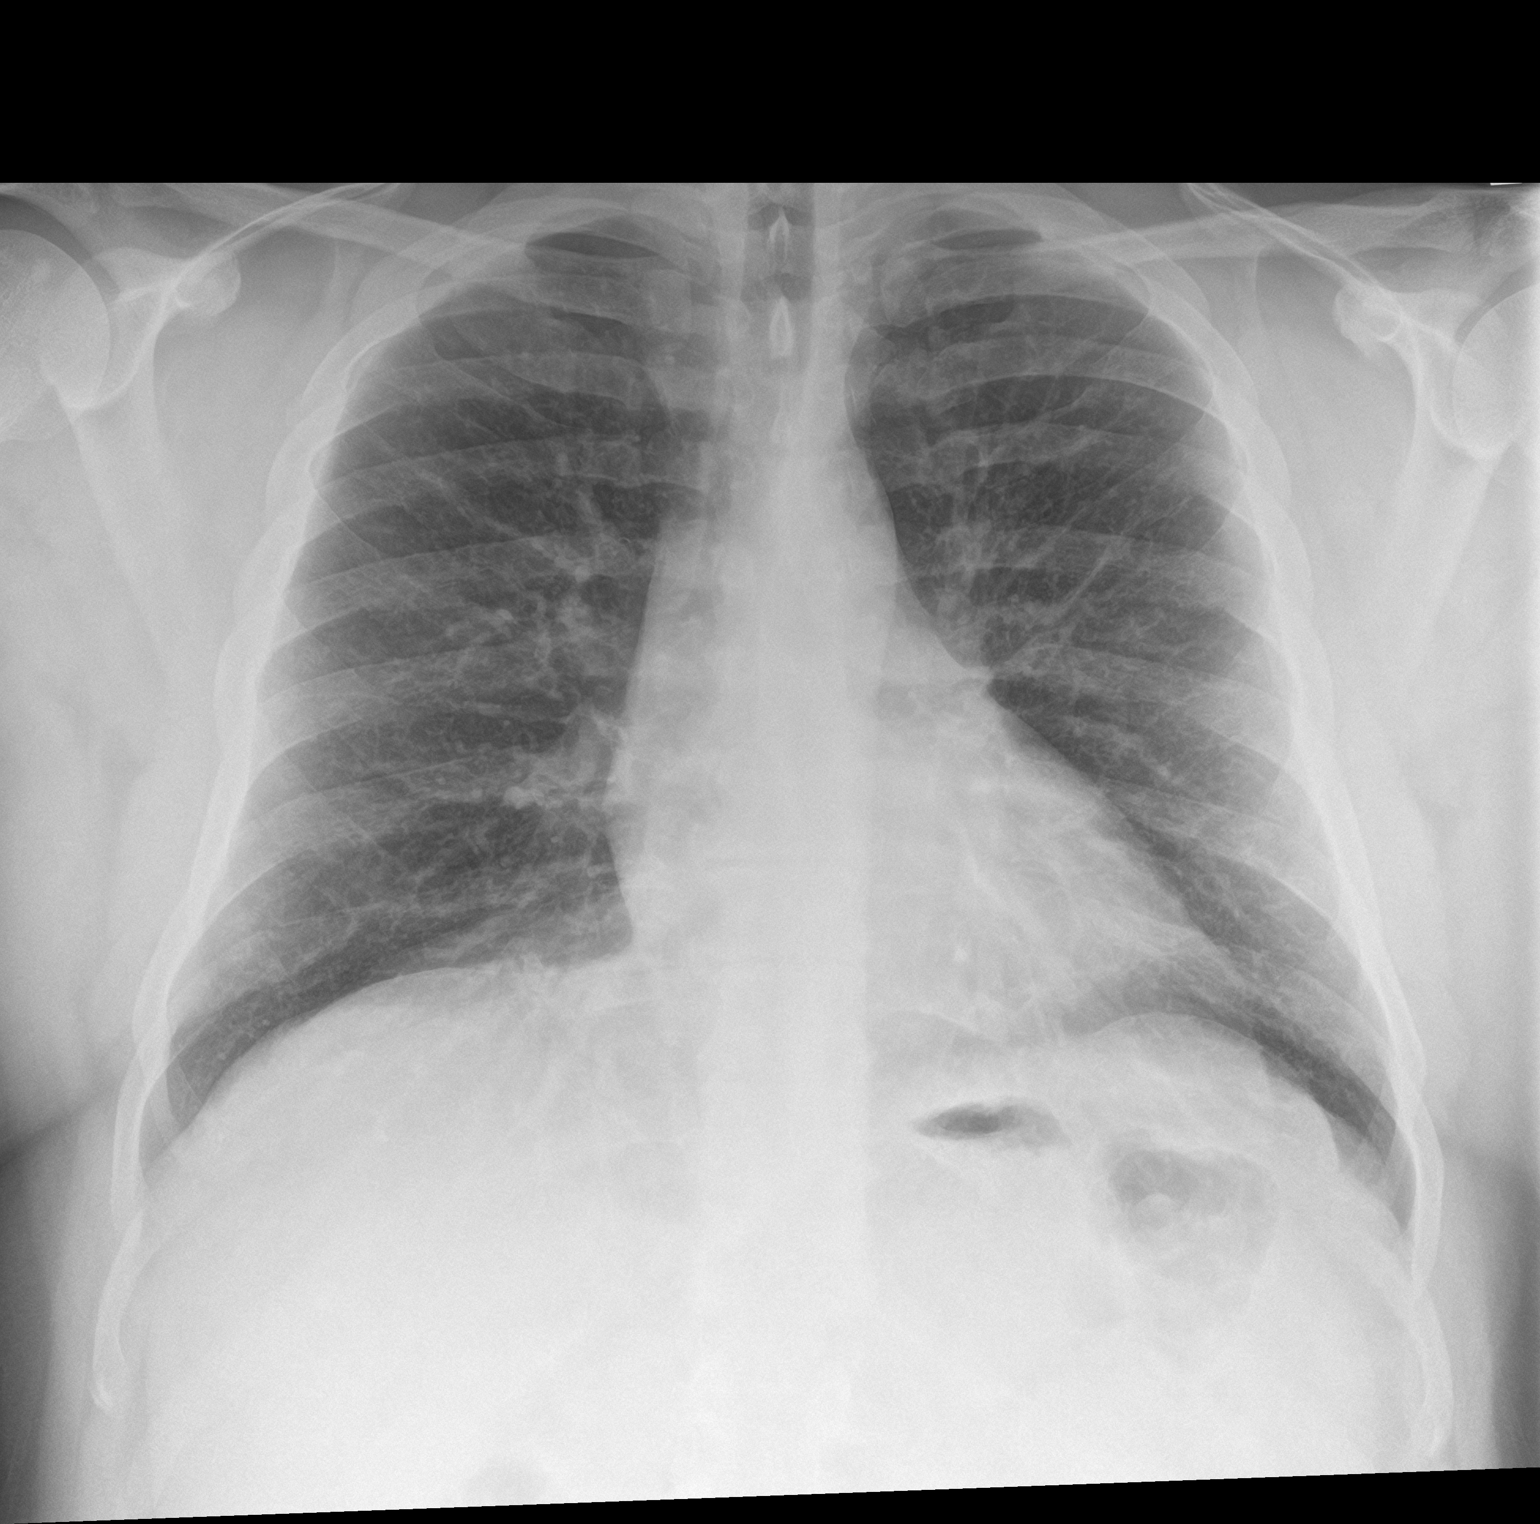

[chest lat]
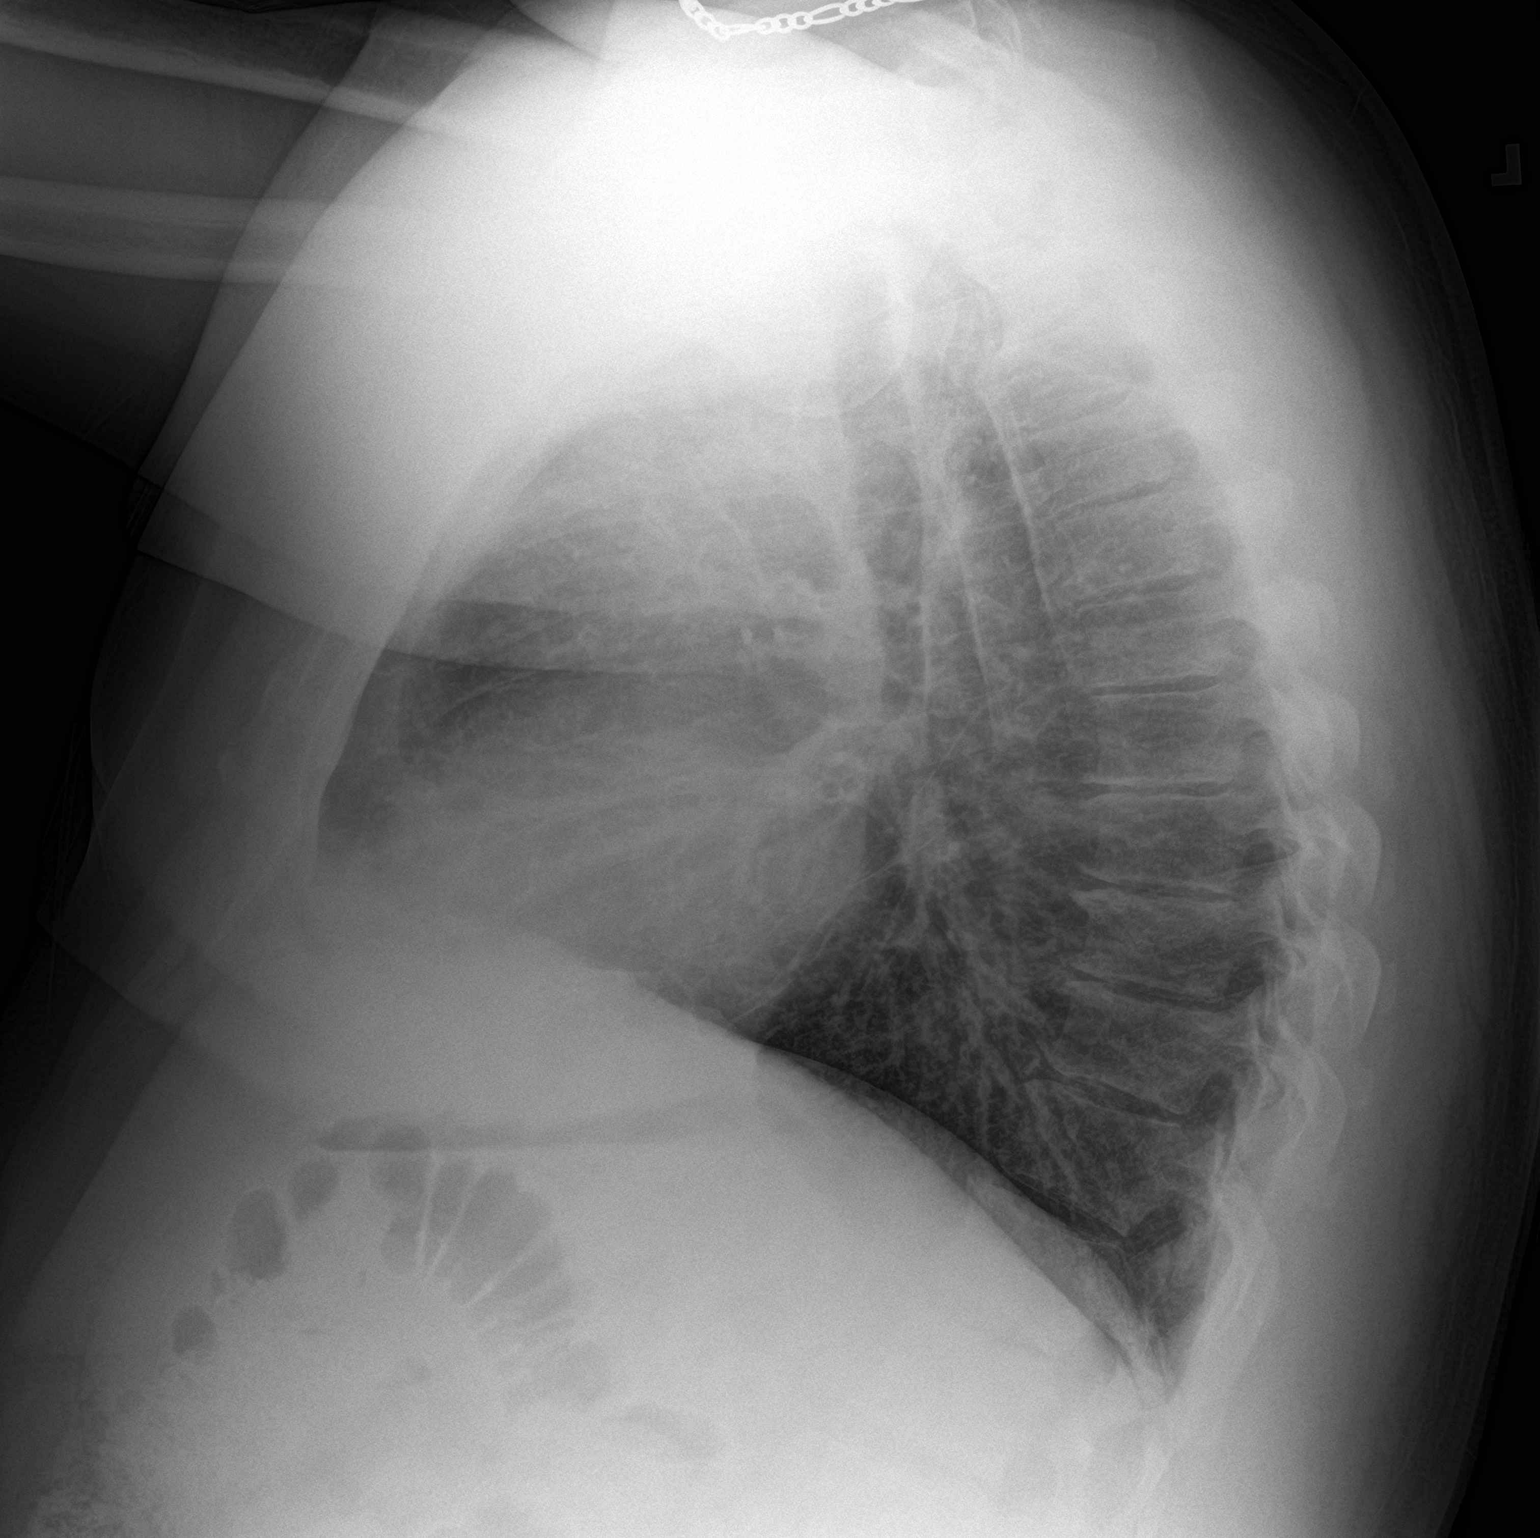

[2 of 2 positions shown; findings below may reference images not displayed]

FINDINGS: Frontal and lateral views of the chest demonstrate an unremarkable
cardiac silhouette. No acute airspace disease, effusion, or
pneumothorax. Interstitial prominence consistent with history of
tobacco abuse.
IMPRESSION: 1. No acute intrathoracic process.

## 2023-02-11 ENCOUNTER — Emergency Department (HOSPITAL_COMMUNITY): Payer: No Typology Code available for payment source

## 2023-02-11 ENCOUNTER — Encounter (HOSPITAL_COMMUNITY): Payer: Self-pay

## 2023-02-11 ENCOUNTER — Emergency Department (HOSPITAL_COMMUNITY)
Admission: EM | Admit: 2023-02-11 | Discharge: 2023-02-12 | Disposition: A | Discharge status: Home / Self Care | Payer: No Typology Code available for payment source | Attending: Emergency Medicine | Admitting: Emergency Medicine

## 2023-02-11 ENCOUNTER — Other Ambulatory Visit: Payer: Self-pay

## 2023-02-11 DIAGNOSIS — M25571 Pain in right ankle and joints of right foot: Secondary | ICD-10-CM | POA: Insufficient documentation

## 2023-02-11 DIAGNOSIS — R519 Headache, unspecified: Secondary | ICD-10-CM | POA: Insufficient documentation

## 2023-02-11 DIAGNOSIS — S0990XA Unspecified injury of head, initial encounter: Secondary | ICD-10-CM

## 2023-02-11 MED ORDER — METOCLOPRAMIDE HCL 5 MG/ML IJ SOLN: 5.0000 mg | Freq: Once | INTRAMUSCULAR | Status: DC

## 2023-02-11 MED ORDER — METOCLOPRAMIDE HCL 5 MG/ML IJ SOLN
5.0000 mg | Freq: Once | INTRAMUSCULAR | Status: AC
  Administered 2023-02-11: 5 mg via INTRAMUSCULAR
  Filled 2023-02-11: qty 2

## 2023-02-11 MED ORDER — DEXAMETHASONE SODIUM PHOSPHATE 10 MG/ML IJ SOLN
10.0000 mg | Freq: Once | INTRAMUSCULAR | Status: AC
  Administered 2023-02-11: 10 mg via INTRAMUSCULAR
  Filled 2023-02-11: qty 1

## 2023-02-11 MED ORDER — KETOROLAC TROMETHAMINE 60 MG/2ML IM SOLN
60.0000 mg | Freq: Once | INTRAMUSCULAR | Status: AC
  Administered 2023-02-11: 60 mg via INTRAMUSCULAR
  Filled 2023-02-11: qty 2

## 2023-02-11 MED ORDER — DEXAMETHASONE SODIUM PHOSPHATE 10 MG/ML IJ SOLN: 10.0000 mg | Freq: Once | INTRAMUSCULAR | Status: DC
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
